# Patient Record
Sex: Male | Born: 1990 | Race: Black or African American | Hispanic: No | Marital: Single | Smoking: Current every day smoker
Health system: Southern US, Community
[De-identification: ages and names within clinical notes are randomized; demographics above are authoritative.]

## PROBLEM LIST (undated history)

## (undated) DIAGNOSIS — A749 Chlamydial infection, unspecified: Secondary | ICD-10-CM

---

## 2016-11-08 ENCOUNTER — Ambulatory Visit (HOSPITAL_COMMUNITY)
Admission: EM | Admit: 2016-11-08 | Discharge: 2016-11-08 | Disposition: A | Discharge status: Home / Self Care | Payer: Self-pay | Attending: Emergency Medicine | Admitting: Emergency Medicine

## 2016-11-08 ENCOUNTER — Encounter (HOSPITAL_COMMUNITY): Payer: Self-pay | Admitting: Emergency Medicine

## 2016-11-08 DIAGNOSIS — Z202 Contact with and (suspected) exposure to infections with a predominantly sexual mode of transmission: Secondary | ICD-10-CM | POA: Insufficient documentation

## 2016-11-08 DIAGNOSIS — F17290 Nicotine dependence, other tobacco product, uncomplicated: Secondary | ICD-10-CM | POA: Insufficient documentation

## 2016-11-08 DIAGNOSIS — Z79899 Other long term (current) drug therapy: Secondary | ICD-10-CM | POA: Insufficient documentation

## 2016-11-08 DIAGNOSIS — Z711 Person with feared health complaint in whom no diagnosis is made: Secondary | ICD-10-CM

## 2016-11-08 MED ORDER — DOXYCYCLINE HYCLATE 100 MG PO CAPS: 100.0000 mg | ORAL_CAPSULE | Freq: Two times a day (BID) | ORAL | 0 refills | Status: DC

## 2016-11-08 MED ORDER — VALACYCLOVIR HCL 1 G PO TABS: ORAL_TABLET | ORAL | 0 refills | Status: DC

## 2016-11-08 NOTE — Discharge Instructions (Signed)
Based on your history and clinical findings there is nothing to strongly suggest a specific type of infection. You are being treated for 2 possible STDs, chlamydia and herpes. A test for both of these have been performed. It is not completely clear at this time that you have these however you are being treated because you are from out of town and will be leaving soon. We will call you for any positive testing and likely can be treated over the telephone for prescriptions if necessary. Read your directions. You will able to follow-up with your primary care physician or health Department for additional STD testing and treatment.

## 2016-11-08 NOTE — ED Notes (Signed)
Clean and dirty urine collected

## 2016-11-08 NOTE — ED Provider Notes (Signed)
CSN: [id number]     Arrival date & time 11/08/16  1826 History   First MD Initiated Contact with Patient 11/08/16 1853     Chief Complaint  Patient presents with  . Exposure to STD   (Consider location/radiation/quality/duration/timing/severity/associated sxs/prior Treatment) 25 year old sexually active male presents to the urgent care with complaints of a tingling feeling to the ventral aspect of the distal shaft of the penis for 2 days. He states there are one or 2 blocks fair. He is concerned about STD. Denies dysuria. He is uncertain as to whether he may have or not have a urethral discharge but later states he did not see anything other than urine.      History reviewed. No pertinent past medical history. History reviewed. No pertinent surgical history. History reviewed. No pertinent family history. Social History  Substance Use Topics  . Smoking status: Current Every Day Smoker    Years: 7.00    Types: Cigars  . Smokeless tobacco: Never Used  . Alcohol use Yes    Review of Systems  Constitutional: Negative.   Gastrointestinal: Negative.   Genitourinary: Positive for genital sores. Negative for dysuria, frequency, penile pain, penile swelling, scrotal swelling, testicular pain and urgency.  Neurological: Negative.     Allergies  Patient has no known allergies.  Home Medications   Prior to Admission medications   Medication Sig Start Date End Date Taking? Authorizing Provider  doxycycline (VIBRAMYCIN) 100 MG capsule Take 1 capsule (100 mg total) by mouth 2 (two) times daily. 11/08/16   Hayden Rasmussenavid Millena Callins, NP  valACYclovir (VALTREX) 1000 MG tablet 1 tab po bid X 7 days 11/08/16   Hayden Rasmussenavid Alister Staver, NP   Meds Ordered and Administered this Visit  Medications - No data to display  BP (!) 176/50 (BP Location: Left Arm)   Pulse 81   Temp 98.6 F (37 C) (Oral)   Resp 14   SpO2 100%  No data found.   Physical Exam  Constitutional: He is oriented to person, place, and time. He  appears well-developed and well-nourished. No distress.  Eyes: EOM are normal.  Neck: Neck supple.  Cardiovascular: Normal rate.   Pulmonary/Chest: Effort normal. No respiratory distress.  Genitourinary:  Genitourinary Comments: Normal male genitalia. Examination to the skin of the distal ventral penile shaft reveals 2 or 3 very small less than 1 mm papules, flesh-colored. No vesicle formation. No erythema. No other lesions seen. No lumps or bumps within the penis. No testicular or scrotal tenderness or abnormalities.  Musculoskeletal: He exhibits no edema.  Neurological: He is alert and oriented to person, place, and time. He exhibits normal muscle tone.  Skin: Skin is warm and dry.  Psychiatric: He has a normal mood and affect.  Nursing note and vitals reviewed.   Urgent Care Course   Clinical Course     Procedures (including critical care time)  Labs Review Labs Reviewed  [facility] CULTURE AND TYPING  URINE CYTOLOGY ANCILLARY ONLY    Imaging Review No results found.   Visual Acuity Review  Right Eye Distance:   Left Eye Distance:   Bilateral Distance:    Right Eye Near:   Left Eye Near:    Bilateral Near:         MDM   1. Concern about STD in male without diagnosis   2. Possible exposure to STD    Based on your history and clinical findings there is nothing to strongly suggest a specific type of infection. You are being treated  for 2 possible STDs, chlamydia and herpes. A test for both of these have been performed. It is not completely clear at this time that you have these however you are being treated because you are from out of town and will be leaving soon. We will call you for any positive testing and likely can be treated over the telephone for prescriptions if necessary. Read your directions. You will able to follow-up with your primary care physician or health Department for additional STD testing and treatment. Meds ordered this encounter  Medications  .  doxycycline (VIBRAMYCIN) 100 MG capsule    Sig: Take 1 capsule (100 mg total) by mouth 2 (two) times daily.    Dispense:  20 capsule    Refill:  0    Order Specific Question:   Supervising Provider    Answer:   Charm RingsHONIG, ERIN J [id number]]  . valACYclovir (VALTREX) 1000 MG tablet    Sig: 1 tab po bid X 7 days    Dispense:  15 tablet    Refill:  0    Order Specific Question:   Supervising Provider    Answer:   Micheline ChapmanHONIG, ERIN J [[id number]]       Hayden Rasmussenavid Kery Haltiwanger, NP 11/08/16 [phone]

## 2016-11-08 NOTE — ED Triage Notes (Signed)
[facility] a [facility] possible exposure to an STD. The patient reported a possible lesion on his penis as well as "tingling."

## 2016-11-09 LAB — URINE CYTOLOGY ANCILLARY ONLY
CHLAMYDIA, DNA PROBE: POSITIVE — AB
NEISSERIA GONORRHEA: NEGATIVE
Trichomonas: NEGATIVE

## 2016-11-10 LAB — HSV CULTURE AND TYPING

## 2018-04-13 ENCOUNTER — Emergency Department (HOSPITAL_COMMUNITY)
Admission: EM | Admit: 2018-04-13 | Discharge: 2018-04-13 | Disposition: A | Discharge status: Home / Self Care | Payer: Self-pay | Attending: Emergency Medicine | Admitting: Emergency Medicine

## 2018-04-13 ENCOUNTER — Other Ambulatory Visit: Payer: Self-pay

## 2018-04-13 ENCOUNTER — Encounter (HOSPITAL_COMMUNITY): Payer: Self-pay

## 2018-04-13 ENCOUNTER — Emergency Department (HOSPITAL_COMMUNITY): Payer: Self-pay

## 2018-04-13 DIAGNOSIS — R079 Chest pain, unspecified: Secondary | ICD-10-CM | POA: Insufficient documentation

## 2018-04-13 DIAGNOSIS — F17290 Nicotine dependence, other tobacco product, uncomplicated: Secondary | ICD-10-CM | POA: Insufficient documentation

## 2018-04-13 HISTORY — DX: Chlamydial infection, unspecified: A74.9

## 2018-04-13 LAB — CBC
HCT: 46.7 % (ref 39.0–52.0)
Hemoglobin: 15.5 g/dL (ref 13.0–17.0)
MCH: 28 pg (ref 26.0–34.0)
MCHC: 33.2 g/dL (ref 30.0–36.0)
MCV: 84.3 fL (ref 78.0–100.0)
PLATELETS: 144 10*3/uL — AB (ref 150–400)
RBC: 5.54 MIL/uL (ref 4.22–5.81)
RDW: 13 % (ref 11.5–15.5)
WBC: 6.2 10*3/uL (ref 4.0–10.5)

## 2018-04-13 LAB — BASIC METABOLIC PANEL
Anion gap: 8 (ref 5–15)
BUN: 15 mg/dL (ref 6–20)
CALCIUM: 9.3 mg/dL (ref 8.9–10.3)
CO2: 26 mmol/L (ref 22–32)
CREATININE: 1.03 mg/dL (ref 0.61–1.24)
Chloride: 106 mmol/L (ref 101–111)
GFR calc Af Amer: >60 mL/min (ref >60)
Glucose, Bld: 76 mg/dL (ref 65–99)
Potassium: 3.8 mmol/L (ref 3.5–5.1)
SODIUM: 140 mmol/L (ref 135–145)

## 2018-04-13 LAB — I-STAT TROPONIN, ED: Troponin i, poc: 0 ng/mL (ref 0.00–0.08)

## 2018-04-13 NOTE — ED Triage Notes (Addendum)
Patient c/o intermittent  left chest pain x 1 week. Patient states it seems to hurt worse after drinking a soda or smoking a black and mild.

## 2018-04-13 NOTE — ED Notes (Signed)
Pt asked if we had a subway. Pt was advised not to have anything to eat or drink until he sees a physician. Pt asked for directions to subway and left for subway.

## 2018-04-13 NOTE — Discharge Instructions (Signed)
It was my pleasure taking care of you today!   You were seen in the Emergency Department today for chest pain.  As we have discussed, today?s blood work and imaging are normal, but you may require further testing.  Please call your primary care physician to schedule a follow up appointment to discuss your ER visit today. If you do not have one, please see the information below or call the clinic listed.   Return to the Emergency Department if you experience any further chest pain/pressure/tightness, difficulty breathing, sudden sweating, or other symptoms that concern you.

## 2018-04-13 NOTE — ED Provider Notes (Signed)
[facility]-EMERGENCY DEPT Provider Note   CSN: [id number] Arrival date & time: 04/13/18  1632     History   Chief Complaint Chief Complaint  Patient presents with  . Chest Pain    HPI Dale Allison is a 27 y.o. male.  The history is provided by the patient and medical records. No language interpreter was used.  Chest Pain   Pertinent negatives include no abdominal pain, no palpitations and no shortness of breath.   Dale Allison is a 27 y.o. male with no pertinent PMH who presents to the Emergency Department complaining of left-sided chest pain which has been occurring daily.  Will last couple hours and then resolved.  Denies alleviating factors.  Pain is worse after drinking carbonated beverages, smoking black and mild or moving his left arm.  No medications taken prior to arrival for symptoms.  Denies history of similar.  No history of hypertension, hyperlipidemia, diabetes or heart disease.  No known family cardiac history.  No recent travel/immobilization/surgeries.  Denies abdominal pain, nausea, vomiting, shortness of breath, back pain, fever, chills, cough.  Past Medical History:  Diagnosis Date  . Chlamydia     There are no active problems to display for this patient.   History reviewed. No pertinent surgical history.      Home Medications    Prior to Admission medications   Not on File    Family History Family History  Problem Relation Age of Onset  . Cancer Mother   . Heart failure Father     Social History Social History   Tobacco Use  . Smoking status: Current Every Day Smoker    Years: 7.00    Types: Cigars  . Smokeless tobacco: Never Used  Substance Use Topics  . Alcohol use: Yes  . Drug use: Yes    Types: Marijuana     Allergies   Patient has no known allergies.   Review of Systems Review of Systems  Respiratory: Negative for shortness of breath.   Cardiovascular: Positive for chest pain. Negative for  palpitations and leg swelling.  Gastrointestinal: Negative for abdominal pain.  All other systems reviewed and are negative.    Physical Exam Updated Vital Signs BP (!) 142/95 (BP Location: Left Arm)   Pulse 72   Temp 98.4 F (36.9 C) (Oral)   Resp 12   Ht  (1.753 m)   Wt 65.8 kg (145 lb)   SpO2 100%   BMI 21.41 kg/m   Physical Exam  Constitutional: He is oriented to person, place, and time. He appears well-developed and well-nourished. No distress.  HENT:  Head: Normocephalic and atraumatic.  Cardiovascular: Normal rate, regular rhythm and normal heart sounds.  No murmur heard. Pulmonary/Chest: Effort normal and breath sounds normal. No respiratory distress. He has no wheezes. He has no rales. He exhibits tenderness.  Abdominal: Soft. He exhibits no distension. There is no tenderness.  Musculoskeletal: He exhibits no edema.  Neurological: He is alert and oriented to person, place, and time.  Skin: Skin is warm and dry.  Nursing note and vitals reviewed.    ED Treatments / Results  Labs (all labs ordered are listed, but only abnormal results are displayed) Labs Reviewed  CBC - Abnormal; Notable for the following components:      Result Value   Platelets 144 (*)    All other components within normal limits  BASIC METABOLIC PANEL  I-STAT TROPONIN, ED    EKG EKG Interpretation  Date/Time:  Friday Apr 13 2018 16:39:31 EDT Ventricular Rate:  63 PR Interval:    QRS Duration: 96 QT Interval:  403 QTC Calculation: 413 R Axis:   -30 Text Interpretation:  Sinus rhythm RSR' in V1 or V2, probably normal variant Left ventricular hypertrophy No old tracing to compare Confirmed by Melene Plan [phone]) on 04/13/2018 7:59:43 PM   Radiology Dg Chest 2 View  Result Date: 04/13/2018 CLINICAL DATA:  Chest pain for 2 weeks. EXAM: CHEST - 2 VIEW COMPARISON:  None. FINDINGS: Lungs are clear. Heart size is normal. No pneumothorax or pleural effusion. No bony abnormality.  IMPRESSION: Normal chest. Electronically Signed   By: Drusilla Kanner M.D.   On: 04/13/2018 17:15    Procedures Procedures (including critical care time)  Medications Ordered in ED Medications - No data to display   Initial Impression / Assessment and Plan / ED Course  I have reviewed the triage vital signs and the nursing notes.  Pertinent labs & imaging results that were available during my care of the patient were reviewed by me and considered in my medical decision making (see chart for details).    Dale Allison is a 27 y.o. male who presents to ED for chest pain x 1 week. On exam, patient with reproducible chest wall tenderness on the left, otherwise normal cardiopulmonary exam.   Labs reviewed and reassuring with negative troponin.  CXR with no acute abnormalities.  EKG non-ischemic.   Heart score low risk at 2. PERC negative  Patient's symptoms unlikely to be of cardiac etiology. Labs and imaging reviewed again prior to discharge. Patient has been advised to return to the ED if development of any exertional chest pain, trouble breathing, new/worsening symptoms or for any additional concerns. Evaluation does not show pathology that would require ongoing emergent intervention or inpatient treatment. Encouraged to follow up with PCP. Referral information included in discharge paperwork. Patient understands return precautions and follow up plan. All questions answered.   Final Clinical Impressions(s) / ED Diagnoses   Final diagnoses:  Chest pain with low risk for cardiac etiology    ED Discharge Orders    None       Alvenia Treese, Chase Picket, PA-C 04/13/18 2010    Melene Plan, DO 04/13/18 2036

## 2018-10-07 ENCOUNTER — Emergency Department (HOSPITAL_COMMUNITY)
Admission: EM | Admit: 2018-10-07 | Discharge: 2018-10-07 | Disposition: A | Discharge status: Home / Self Care | Payer: Self-pay | Attending: Emergency Medicine | Admitting: Emergency Medicine

## 2018-10-07 ENCOUNTER — Encounter (HOSPITAL_COMMUNITY): Payer: Self-pay | Admitting: *Deleted

## 2018-10-07 DIAGNOSIS — F17210 Nicotine dependence, cigarettes, uncomplicated: Secondary | ICD-10-CM | POA: Insufficient documentation

## 2018-10-07 DIAGNOSIS — J069 Acute upper respiratory infection, unspecified: Secondary | ICD-10-CM | POA: Insufficient documentation

## 2018-10-07 MED ORDER — BENZONATATE 200 MG PO CAPS: 200.0000 mg | ORAL_CAPSULE | Freq: Three times a day (TID) | ORAL | 0 refills | Status: AC

## 2018-10-07 NOTE — ED Triage Notes (Signed)
Pt reports back pain, headache, fever x 2 days. Pt tried tylenol but states head still hurts.

## 2018-10-07 NOTE — ED Provider Notes (Signed)
[facility]-EMERGENCY DEPT Provider Note   CSN: [id number] Arrival date & time: 10/07/18  1132     History   Chief Complaint Chief Complaint  Patient presents with  . Headache    HPI Dale Allison is a 27 y.o. male.  27 year old male presents with complaint of productive cough, fever, headache, low back pain.  Patient states that he first noticed symptoms on Friday night, low back ache and feeling hot, symptoms resolved with taking Tylenol.  Patient states that he woke up on Saturday (yesterday) and felt like he could not get warm despite wearing a coat and turning the heat in his home, did not check his temperature but states that his head felt hot, reports nausea, headache and ongoing cough.  Symptoms improved with taking Tylenol again.  Patient states that he is feeling somewhat better today however still feels cold and feels like he is running a fever and came to the emergency room today for a note for work.  Patient denies sick contacts, neck pain or stiffness, body aches, wheezing, shortness of breath, congestion, sore throat, postnasal drip, changes in bowel or bladder habits.  Patient denies history of asthma or chronic lung disease, patient is otherwise healthy, smokes black and mild's, does not vape.  No other complaints or concerns.     Past Medical History:  Diagnosis Date  . Chlamydia     There are no active problems to display for this patient.   History reviewed. No pertinent surgical history.      Home Medications    Prior to Admission medications   Medication Sig Start Date End Date Taking? Authorizing Provider  acetaminophen (TYLENOL) 500 MG tablet Take 1,000 mg by mouth every 6 (six) hours as needed for moderate pain or headache.   Yes [provider]  benzonatate (TESSALON) 200 MG capsule Take 1 capsule (200 mg total) by mouth 3 (three) times daily for 10 days. 10/07/18 10/17/18  Jeannie FendMurphy,  A, PA-C    Family  History Family History  Problem Relation Age of Onset  . Cancer Mother   . Heart failure Father     Social History Social History   Tobacco Use  . Smoking status: Current Every Day Smoker    Years: 7.00    Types: Cigars  . Smokeless tobacco: Never Used  Substance Use Topics  . Alcohol use: Yes  . Drug use: Yes    Types: Marijuana     Allergies   Patient has no known allergies.   Review of Systems Review of Systems  Constitutional: Negative for chills and diaphoresis.  HENT: Negative for congestion, ear pain, postnasal drip, rhinorrhea, sinus pressure, sinus pain, sneezing and sore throat.   Respiratory: Positive for cough. Negative for shortness of breath and wheezing.   Cardiovascular: Negative for chest pain.  Gastrointestinal: Positive for nausea. Negative for abdominal pain, constipation, diarrhea and vomiting.  Genitourinary: Negative for dysuria and frequency.  Musculoskeletal: Positive for back pain.  Skin: Negative for rash and wound.  Allergic/Immunologic: Negative for immunocompromised state.  Neurological: Positive for headaches.  Hematological: Negative for adenopathy.  Psychiatric/Behavioral: Negative for confusion.  All other systems reviewed and are negative.    Physical Exam Updated Vital Signs BP (!) 150/93 (BP Location: Right Arm)   Pulse 65   Temp 99.2 F (37.3 C) (Oral)   Resp 16   Ht 5\' 9"  (1.753 m)   Wt 65.8 kg   SpO2 100%   BMI 21.41 kg/m  Physical Exam  Constitutional: He is oriented to person, place, and time. He appears well-developed and well-nourished. No distress.  HENT:  Head: Normocephalic and atraumatic.  Right Ear: Tympanic membrane, external ear and ear canal normal.  Left Ear: Tympanic membrane, external ear and ear canal normal.  Nose: Nose normal. No mucosal edema.  Mouth/Throat: Oropharynx is clear and moist and mucous membranes are normal. No trismus in the jaw. No oropharyngeal exudate.  Eyes: Conjunctivae are  normal.  Neck: Normal range of motion. Neck supple.  Cardiovascular: Normal rate, regular rhythm, normal heart sounds and intact distal pulses.  No murmur heard. Pulmonary/Chest: Effort normal and breath sounds normal. No respiratory distress.  Musculoskeletal: He exhibits no tenderness.       Cervical back: Normal.       Lumbar back: Normal.  Lymphadenopathy:    He has no cervical adenopathy.  Neurological: He is alert and oriented to person, place, and time. He has normal strength. GCS eye subscore is 4. GCS verbal subscore is 5. GCS motor subscore is 6.  Skin: Skin is warm and dry. He is not diaphoretic.  Psychiatric: He has a normal mood and affect. His behavior is normal.  Nursing note and vitals reviewed.    ED Treatments / Results  Labs (all labs ordered are listed, but only abnormal results are displayed) Labs Reviewed - No data to display  EKG None  Radiology No results found.  Procedures Procedures (including critical care time)  Medications Ordered in ED Medications - No data to display   Initial Impression / Assessment and Plan / ED Course  I have reviewed the triage vital signs and the nursing notes.  Pertinent labs & imaging results that were available during my care of the patient were reviewed by me and considered in my medical decision making (see chart for details).  Clinical Course as of Oct 07 1253  Wynelle Link Oct 07, 2018  [id number] 26 year old well-appearing male with no significant past medical history presents with complaint of productive cough, feeling feverish, backache.  Patient states his symptoms started 2 days ago, managing Tylenol, has not had Tylenol since last night and is afebrile in triage.  On exam lung sounds are clear, neck supple, no evidence of meningitis at this time. Discussed reasons to return to Er, home to rest, push fluids, take Motrin and Tylenol as needed as directed, monitor his temperature.  Given prescription for Tessalon for his cough.   Advised to return to the emergency room for any worsening or concerning symptoms otherwise given referral for PCP follow-up.  Given work note as requested.   [LM]    Clinical Course User Index [LM] Jeannie Fend, PA-C   Final Clinical Impressions(s) / ED Diagnoses   Final diagnoses:  Viral upper respiratory tract infection    ED Discharge Orders         Ordered    benzonatate (TESSALON) 200 MG capsule  3 times daily     10/07/18 1249           Jeannie Fend, PA-C 10/07/18 1254    Lorre Nick, MD 10/08/18 2332

## 2018-10-07 NOTE — Discharge Instructions (Addendum)
Take tessalon as needed as prescribed for cough. Take Motrin and Tylenol as needed as directed. Home to rest, push hydrating fluids such as Gatorade 2. Return to ER for worsening symptoms especially rash, neck pain/stiffness, worsening headache. Monitor your temperature with a thermometer.

## 2018-10-09 ENCOUNTER — Emergency Department (HOSPITAL_COMMUNITY)
Admission: EM | Admit: 2018-10-09 | Discharge: 2018-10-09 | Disposition: A | Discharge status: Home / Self Care | Payer: Self-pay | Attending: Emergency Medicine | Admitting: Emergency Medicine

## 2018-10-09 ENCOUNTER — Encounter (HOSPITAL_COMMUNITY): Payer: Self-pay

## 2018-10-09 ENCOUNTER — Other Ambulatory Visit: Payer: Self-pay

## 2018-10-09 DIAGNOSIS — F17210 Nicotine dependence, cigarettes, uncomplicated: Secondary | ICD-10-CM | POA: Insufficient documentation

## 2018-10-09 DIAGNOSIS — R05 Cough: Secondary | ICD-10-CM | POA: Insufficient documentation

## 2018-10-09 DIAGNOSIS — J111 Influenza due to unidentified influenza virus with other respiratory manifestations: Secondary | ICD-10-CM | POA: Insufficient documentation

## 2018-10-09 DIAGNOSIS — R6889 Other general symptoms and signs: Secondary | ICD-10-CM

## 2018-10-09 NOTE — ED Triage Notes (Signed)
Pt reports continued body aches and chills after being seen for same on Sunday. He states that he feels like he is improving, but his work note only lasted until today and he does not think that he did not feel up to going to work today. No new symptoms. He states that he did not get the prescriptions filled that he was given and has been treating himself with tylenol and ibuprofen instead. A&Ox4.

## 2018-10-09 NOTE — Discharge Instructions (Signed)
Alternate 600 mg of ibuprofen and [phone] mg of Tylenol every 3 hours as needed for pain. Do not exceed 4000 mg of Tylenol daily.  Drink plenty of water and get plenty of rest.  Continue taking the over-the-counter medications you have been taking as prescribed as needed for your symptoms.  Return to the emergency department if any concerning signs or symptoms develop such as neck stiffness, severe headaches, passing out, persistent vomiting, or shortness of breath.

## 2018-10-09 NOTE — ED Provider Notes (Signed)
[facility]-EMERGENCY DEPT Provider Note   CSN: [id number] Arrival date & time: 10/09/18  1657     History   Chief Complaint Chief Complaint  Patient presents with  . Generalized Body Aches    HPI Dale Allison is a 27 y.o. male presents for evaluation of ongoing but improving cough, nasal congestion, fever, headaches, and generalized body aches for 4 days.  He was seen and evaluated in the ER 2 days ago for the symptoms and was found to be stable for discharge home with a prescription for Tessalon which he has not filled.  He reports cough intermittently productive of clear or yellowish sputum and intermittent nasal congestion.  He denies shortness of breath, chest pain, abdominal pain, nausea, or vomiting.  Reports subjective fevers and chills and frontal headache.  Denies neck pain or stiffness.  Headache is not significantly worse than headaches he has had in the past but is more persistent but improves with ibuprofen and Tylenol.  He reports that he has returned because he still feels too unwell to go to work but is encouraged that his symptoms are improving.  He is requesting a work note.  Denies recent travel out of the country or contact with somebody was recently traveled out of the country.  Currently smokes cigarettes and weed daily.  The history is provided by the patient.    Past Medical History:  Diagnosis Date  . Chlamydia     There are no active problems to display for this patient.   History reviewed. No pertinent surgical history.      Home Medications    Prior to Admission medications   Medication Sig Start Date End Date Taking? Authorizing Provider  acetaminophen (TYLENOL) 500 MG tablet Take 1,000 mg by mouth every 6 (six) hours as needed for moderate pain or headache.    [provider]  benzonatate (TESSALON) 200 MG capsule Take 1 capsule (200 mg total) by mouth 3 (three) times daily for 10 days. 10/07/18 10/17/18  Jeannie Fend, PA-C    Family History Family History  Problem Relation Age of Onset  . Cancer Mother   . Heart failure Father     Social History Social History   Tobacco Use  . Smoking status: Current Every Day Smoker    Years: 7.00    Types: Cigars  . Smokeless tobacco: Never Used  Substance Use Topics  . Alcohol use: Yes  . Drug use: Yes    Types: Marijuana     Allergies   Patient has no known allergies.   Review of Systems Review of Systems  Constitutional: Positive for chills and fever.  HENT: Positive for congestion. Negative for sore throat.   Eyes: Negative for visual disturbance.  Respiratory: Positive for cough. Negative for shortness of breath.   Cardiovascular: Negative for chest pain.  Gastrointestinal: Negative for abdominal pain, nausea and vomiting.  Musculoskeletal: Positive for myalgias. Negative for neck pain and neck stiffness.  Neurological: Positive for headaches. Negative for syncope, weakness and light-headedness.     Physical Exam Updated Vital Signs BP (!) 149/92 (BP Location: Right Arm)   Pulse 92   Temp 100.3 F (37.9 C) (Oral)   Resp 16   SpO2 100%   Physical Exam  Constitutional: He appears well-developed and well-nourished. No distress.  Resting comfortably in chair  HENT:  Head: Normocephalic and atraumatic.  Right Ear: Tympanic membrane normal.  Left Ear: Tympanic membrane normal.  Nose: Right sinus exhibits no  maxillary sinus tenderness and no frontal sinus tenderness. Left sinus exhibits no maxillary sinus tenderness and no frontal sinus tenderness.  Mouth/Throat: Uvula is midline. No trismus in the jaw. No posterior oropharyngeal edema or posterior oropharyngeal erythema. Tonsils are 1+ on the right. Tonsils are 1+ on the left. No tonsillar exudate.  Tolerating secretions without difficulty, no sublingual abnormalities.  Mild nasal congestion bilaterally.  Eyes: Conjunctivae are normal. Right eye exhibits no discharge. Left eye  exhibits no discharge.  Neck: Normal range of motion. Neck supple. No JVD present. No tracheal deviation present.  Cardiovascular: Normal rate, regular rhythm and normal heart sounds.  Pulmonary/Chest: Effort normal and breath sounds normal. No stridor. No respiratory distress. He has no wheezes. He has no rales. He exhibits no tenderness.  Abdominal: Soft. Bowel sounds are normal. He exhibits no distension. There is no tenderness.  Musculoskeletal: He exhibits no edema.  Lymphadenopathy:    He has no cervical adenopathy.  Neurological: He is alert.  Skin: Skin is warm and dry. No erythema.  Psychiatric: He has a normal mood and affect. His behavior is normal.  Nursing note and vitals reviewed.    ED Treatments / Results  Labs (all labs ordered are listed, but only abnormal results are displayed) Labs Reviewed - No data to display  EKG None  Radiology No results found.  Procedures Procedures (including critical care time)  Medications Ordered in ED Medications - No data to display   Initial Impression / Assessment and Plan / ED Course  I have reviewed the triage vital signs and the nursing notes.  Pertinent labs & imaging results that were available during my care of the patient were reviewed by me and considered in my medical decision making (see chart for details).     Patient with ongoing URI symptoms.  Low-grade fever, vital signs otherwise stable in the ED.  Nontoxic in appearance.  Tolerating secretions without difficulty.  No meningeal signs or nuchal rigidity to suggest meningitis.  Headache is similar to headaches he has experienced in the past.  Offered chest x-ray to rule out pneumonia but he declines due to cost.  He does have clear breath sounds bilaterally and I have a generally low suspicion of pneumonia.  No evidence of strep pharyngitis, PTA, retropharyngeal abscess, or deep space neck infection.  He is improving symptomatically but does not feel well enough  to go to work yet.  He appears well-hydrated.  Will give work note, encouraged continued over-the-counter medications and fluid hydration.  Discussed strict ED return precautions. Pt verbalized understanding of and agreement with plan and is safe for discharge home at this time.  No complaints prior to discharge.   Final Clinical Impressions(s) / ED Diagnoses   Final diagnoses:  Flu-like symptoms    ED Discharge Orders    None       Bennye AlmFawze, Mariam Helbert A, PA-C 10/09/18 1741    Lorre NickAllen, Anthony, MD 10/13/18 [phone]

## 2020-11-09 ENCOUNTER — Emergency Department (HOSPITAL_BASED_OUTPATIENT_CLINIC_OR_DEPARTMENT_OTHER)
Admission: EM | Admit: 2020-11-09 | Discharge: 2020-11-09 | Disposition: A | Discharge status: Home / Self Care | Payer: Self-pay | Attending: Emergency Medicine | Admitting: Emergency Medicine

## 2020-11-09 ENCOUNTER — Other Ambulatory Visit: Payer: Self-pay

## 2020-11-09 ENCOUNTER — Emergency Department (HOSPITAL_BASED_OUTPATIENT_CLINIC_OR_DEPARTMENT_OTHER): Payer: Self-pay

## 2020-11-09 ENCOUNTER — Encounter (HOSPITAL_BASED_OUTPATIENT_CLINIC_OR_DEPARTMENT_OTHER): Payer: Self-pay

## 2020-11-09 DIAGNOSIS — R079 Chest pain, unspecified: Secondary | ICD-10-CM | POA: Insufficient documentation

## 2020-11-09 DIAGNOSIS — F17290 Nicotine dependence, other tobacco product, uncomplicated: Secondary | ICD-10-CM | POA: Insufficient documentation

## 2020-11-09 LAB — RAPID URINE DRUG SCREEN, HOSP PERFORMED
Amphetamines: NOT DETECTED
Barbiturates: NOT DETECTED
Benzodiazepines: NOT DETECTED
Cocaine: NOT DETECTED
Opiates: NOT DETECTED
Tetrahydrocannabinol: POSITIVE — AB

## 2020-11-09 LAB — TROPONIN I (HIGH SENSITIVITY)
Troponin I (High Sensitivity): 4 ng/L (ref <18)
Troponin I (High Sensitivity): 4 ng/L (ref <18)

## 2020-11-09 LAB — CBC
HCT: 46.9 % (ref 39.0–52.0)
Hemoglobin: 15.5 g/dL (ref 13.0–17.0)
MCH: 28.5 pg (ref 26.0–34.0)
MCHC: 33 g/dL (ref 30.0–36.0)
MCV: 86.4 fL (ref 80.0–100.0)
Platelets: 165 10*3/uL (ref 150–400)
RBC: 5.43 MIL/uL (ref 4.22–5.81)
RDW: 13.7 % (ref 11.5–15.5)
WBC: 5.4 10*3/uL (ref 4.0–10.5)
nRBC: 0 % (ref 0.0–0.2)

## 2020-11-09 LAB — URINALYSIS, ROUTINE W REFLEX MICROSCOPIC
Bilirubin Urine: NEGATIVE
Glucose, UA: NEGATIVE mg/dL
Hgb urine dipstick: NEGATIVE
Ketones, ur: NEGATIVE mg/dL
Leukocytes,Ua: NEGATIVE
Nitrite: NEGATIVE
Protein, ur: NEGATIVE mg/dL
Specific Gravity, Urine: 1.03 (ref 1.005–1.030)
pH: 6 (ref 5.0–8.0)

## 2020-11-09 LAB — BASIC METABOLIC PANEL
Anion gap: 11 (ref 5–15)
BUN: 12 mg/dL (ref 6–20)
CO2: 24 mmol/L (ref 22–32)
Calcium: 8.9 mg/dL (ref 8.9–10.3)
Chloride: 101 mmol/L (ref 98–111)
Creatinine, Ser: 0.97 mg/dL (ref 0.61–1.24)
GFR, Estimated: >60 mL/min (ref >60)
Glucose, Bld: 89 mg/dL (ref 70–99)
Potassium: 4.1 mmol/L (ref 3.5–5.1)
Sodium: 136 mmol/L (ref 135–145)

## 2020-11-09 NOTE — ED Triage Notes (Signed)
Pt reports chest tightness this morning in the center of his chest along with pain in his jaw while working that lasted 4-5 mins. Pt states it comes and goes. Pt was sent from work. Pt reports being diaphoretic but denies n/v or shortness of breath. Pt denies tightness or pain at this time.

## 2020-11-09 NOTE — Discharge Instructions (Addendum)
Please schedule appt with primary doctor. Return for any new or worsening chest pain.

## 2020-11-09 NOTE — ED Provider Notes (Signed)
[facility] EMERGENCY DEPARTMENT Provider Note   CSN: [id number] Arrival date & time: 11/09/20  1013     History Chief Complaint  Patient presents with  . Chest Pain    Dale Allison is a 29 y.o. male.  Presents here with concern for chest pain.  Initial episode of chest pain occurred earlier this morning while he was at work.  States he was not exerting himself at the time.  Episode lasted a few minutes and resolved.  States that he since has had a couple more episodes.  Mostly center of his chest, occasionally radiated to the jaw.  Did not have any associated shortness of breath.  Currently pain free.  States that his father was diagnosed with heart failure but is unsure if he had any coronary artery disease.  Currently smokes.  Denies history of any medical problems.  HPI     Past Medical History:  Diagnosis Date  . Chlamydia     There are no problems to display for this patient.   History reviewed. No pertinent surgical history.     Family History  Problem Relation Age of Onset  . Cancer Mother   . Heart failure Father     Social History   Tobacco Use  . Smoking status: Current Every Day Smoker    Years: 7.00    Types: Cigars  . Smokeless tobacco: Never Used  Vaping Use  . Vaping Use: Never used  Substance Use Topics  . Alcohol use: Yes  . Drug use: Yes    Types: Marijuana    Home Medications Prior to Admission medications   Medication Sig Start Date End Date Taking? Authorizing Provider  acetaminophen (TYLENOL) 500 MG tablet Take 1,000 mg by mouth every 6 (six) hours as needed for moderate pain or headache.    [provider]    Allergies    Patient has no known allergies.  Review of Systems   Review of Systems  Constitutional: Negative for chills and fever.  HENT: Negative for ear pain and sore throat.   Eyes: Negative for pain and visual disturbance.  Respiratory: Positive for chest tightness. Negative for cough and  shortness of breath.   Cardiovascular: Positive for chest pain. Negative for palpitations.  Gastrointestinal: Negative for abdominal pain and vomiting.  Genitourinary: Negative for dysuria and hematuria.  Musculoskeletal: Negative for arthralgias and back pain.  Skin: Negative for color change and rash.  Neurological: Negative for seizures and syncope.  All other systems reviewed and are negative.   Physical Exam Updated Vital Signs BP (!) 145/104   Pulse 64   Temp 98.4 F (36.9 C) (Oral)   Resp 19   Ht 5\' 8"  (1.727 m)   Wt 68 kg   SpO2 100%   BMI 22.81 kg/m   Physical Exam Vitals and nursing note reviewed.  Constitutional:      Appearance: He is well-developed and well-nourished.  HENT:     Head: Normocephalic and atraumatic.  Eyes:     Conjunctiva/sclera: Conjunctivae normal.  Cardiovascular:     Rate and Rhythm: Normal rate and regular rhythm.     Heart sounds: No murmur heard.   Pulmonary:     Effort: Pulmonary effort is normal. No respiratory distress.     Breath sounds: Normal breath sounds.  Abdominal:     Palpations: Abdomen is soft.     Tenderness: There is no abdominal tenderness.  Musculoskeletal:        General: No edema.  Cervical back: Neck supple.     Right lower leg: No edema.     Left lower leg: No edema.  Skin:    General: Skin is warm and dry.  Neurological:     General: No focal deficit present.     Mental Status: He is alert and oriented to person, place, and time.  Psychiatric:        Mood and Affect: Mood and affect normal.     ED Results / Procedures / Treatments   Labs (all labs ordered are listed, but only abnormal results are displayed) Labs Reviewed  RAPID URINE DRUG SCREEN, HOSP PERFORMED - Abnormal; Notable for the following components:      Result Value   Tetrahydrocannabinol POSITIVE (*)    All other components within normal limits  BASIC METABOLIC PANEL  CBC  URINALYSIS, ROUTINE W REFLEX MICROSCOPIC  TROPONIN I  (HIGH SENSITIVITY)  TROPONIN I (HIGH SENSITIVITY)    EKG EKG Interpretation  Date/Time:  Monday November 09 2020 10:13:53 EST Ventricular Rate:  66 PR Interval:  146 QRS Duration: 88 QT Interval:  392 QTC Calculation: 410 R Axis:   79 Text Interpretation: Normal sinus rhythm with sinus arrhythmia Normal ECG Confirmed by Marianna Fuss ([id number]) on 11/09/2020 11:19:03 AM   Radiology DG Chest 2 View  Result Date: 11/09/2020 CLINICAL DATA:  Chest pain EXAM: CHEST - 2 VIEW COMPARISON:  04/13/2018 FINDINGS: The heart size and mediastinal contours are within normal limits. Both lungs are clear. The visualized skeletal structures are unremarkable. IMPRESSION: No active cardiopulmonary disease. Electronically Signed   By: Marlan Palau M.D.   On: 11/09/2020 13:15    Procedures Procedures (including critical care time)  Medications Ordered in ED Medications - No data to display  ED Course  I have reviewed the triage vital signs and the nursing notes.  Pertinent labs & imaging results that were available during my care of the patient were reviewed by me and considered in my medical decision making (see chart for details).    MDM Rules/Calculators/A&P                         29 year old male presenting to ER with concern for chest pain.  On exam patient noted to be well-appearing in no distress.  Vital signs stable.  CXR negative.  EKG without acute ischemic change, troponin x2 within normal limits.  Given atypical description and this work-up, very low suspicion for ACS at this time.  Currently pain-free and well-appearing, believe he is appropriate for outpatient management at this time.  Recommend follow-up with primary doctor.  After the discussed management above, the patient was determined to be safe for discharge.  The patient was in agreement with this plan and all questions regarding their care were answered.  ED return precautions were discussed and the patient will return to  the ED with any significant worsening of condition.    Final Clinical Impression(s) / ED Diagnoses Final diagnoses:  Chest pain, unspecified type    Rx / DC Orders ED Discharge Orders    None       Milagros Loll, MD 11/10/20 [phone]

## 2021-06-20 IMAGING — DX DG CHEST 2V
2 series · 2 of 2 positions shown · non-contrast
Comparison: 04/13/2018

CLINICAL DATA: Chest pain

EXAM:
CHEST - 2 VIEW

[chest pa]
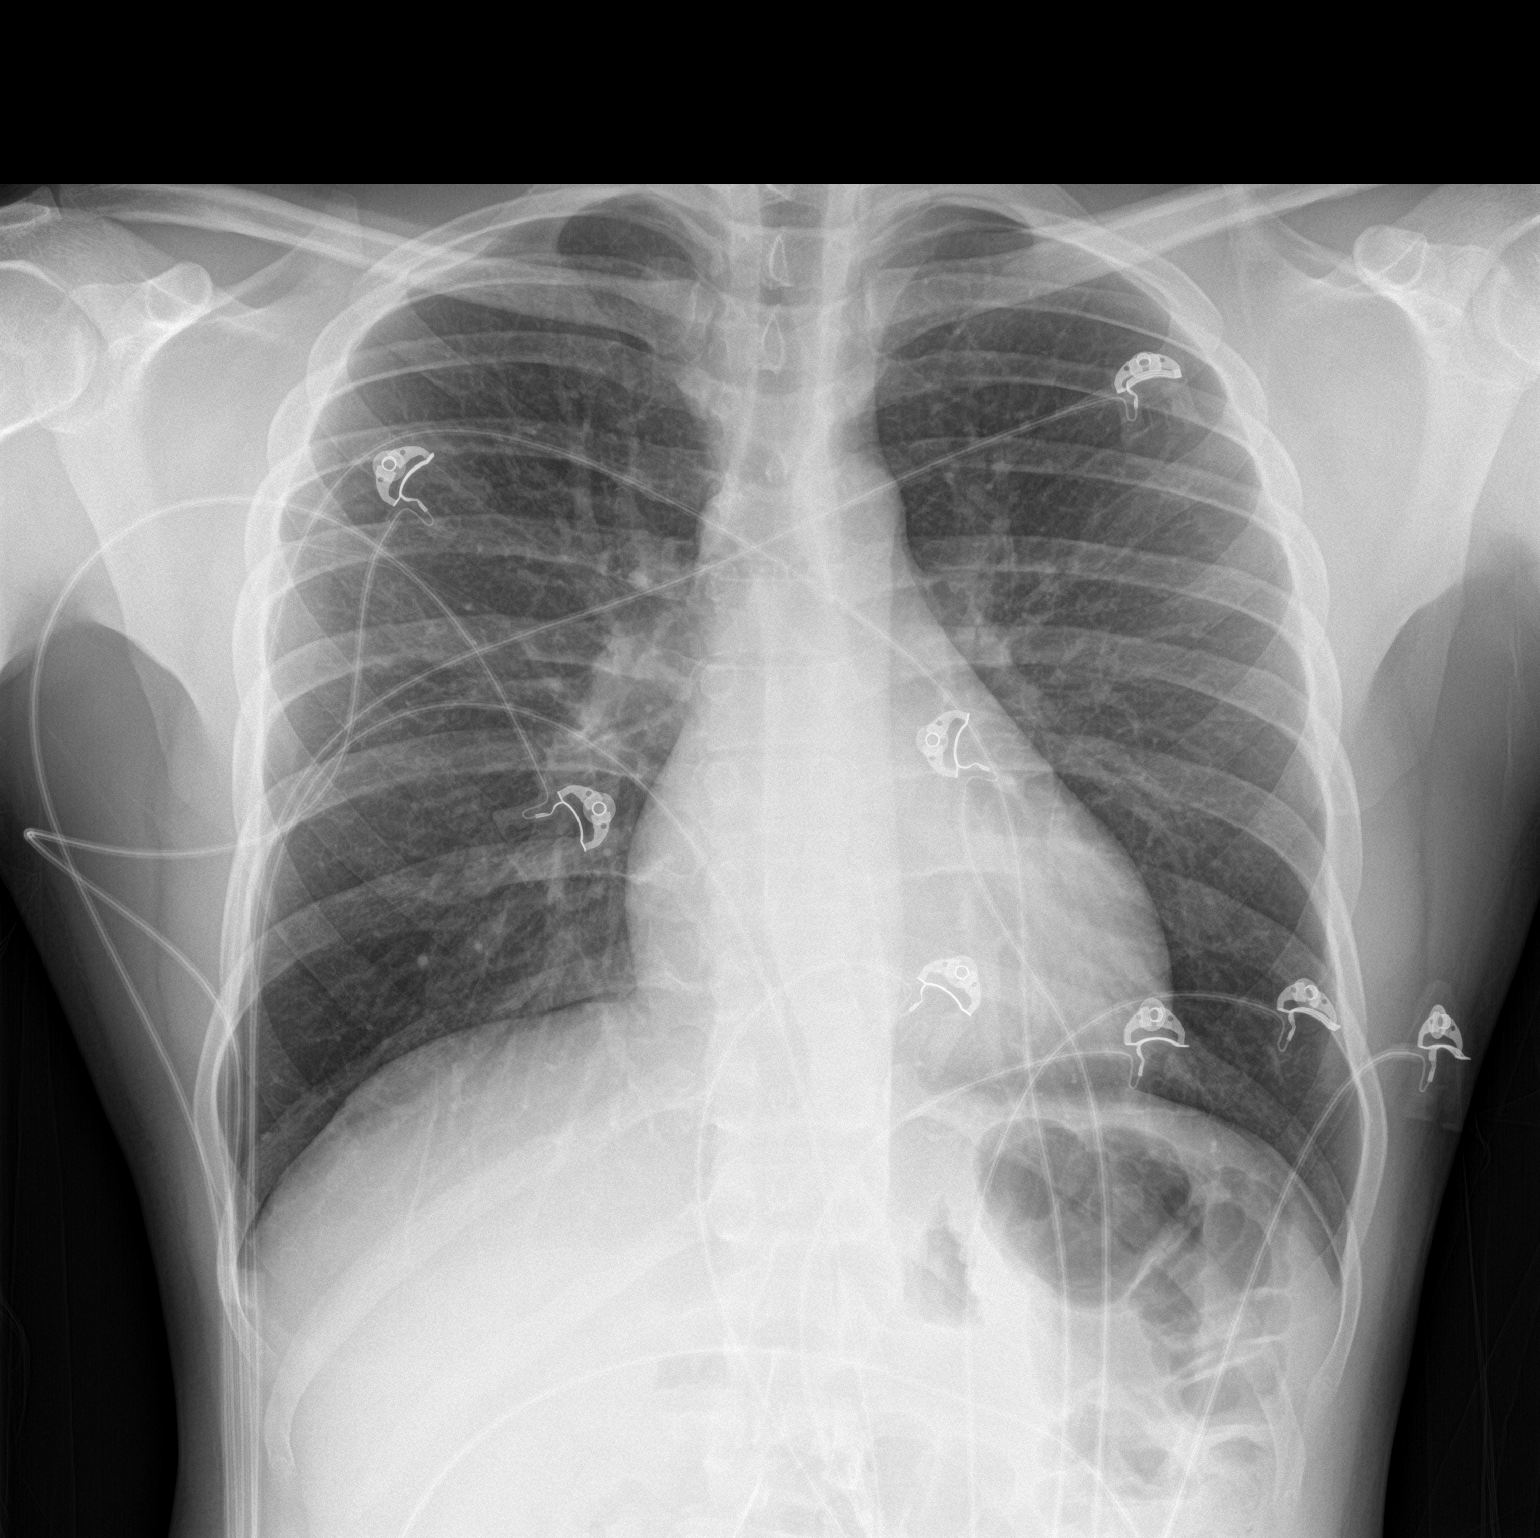

[chest lat]
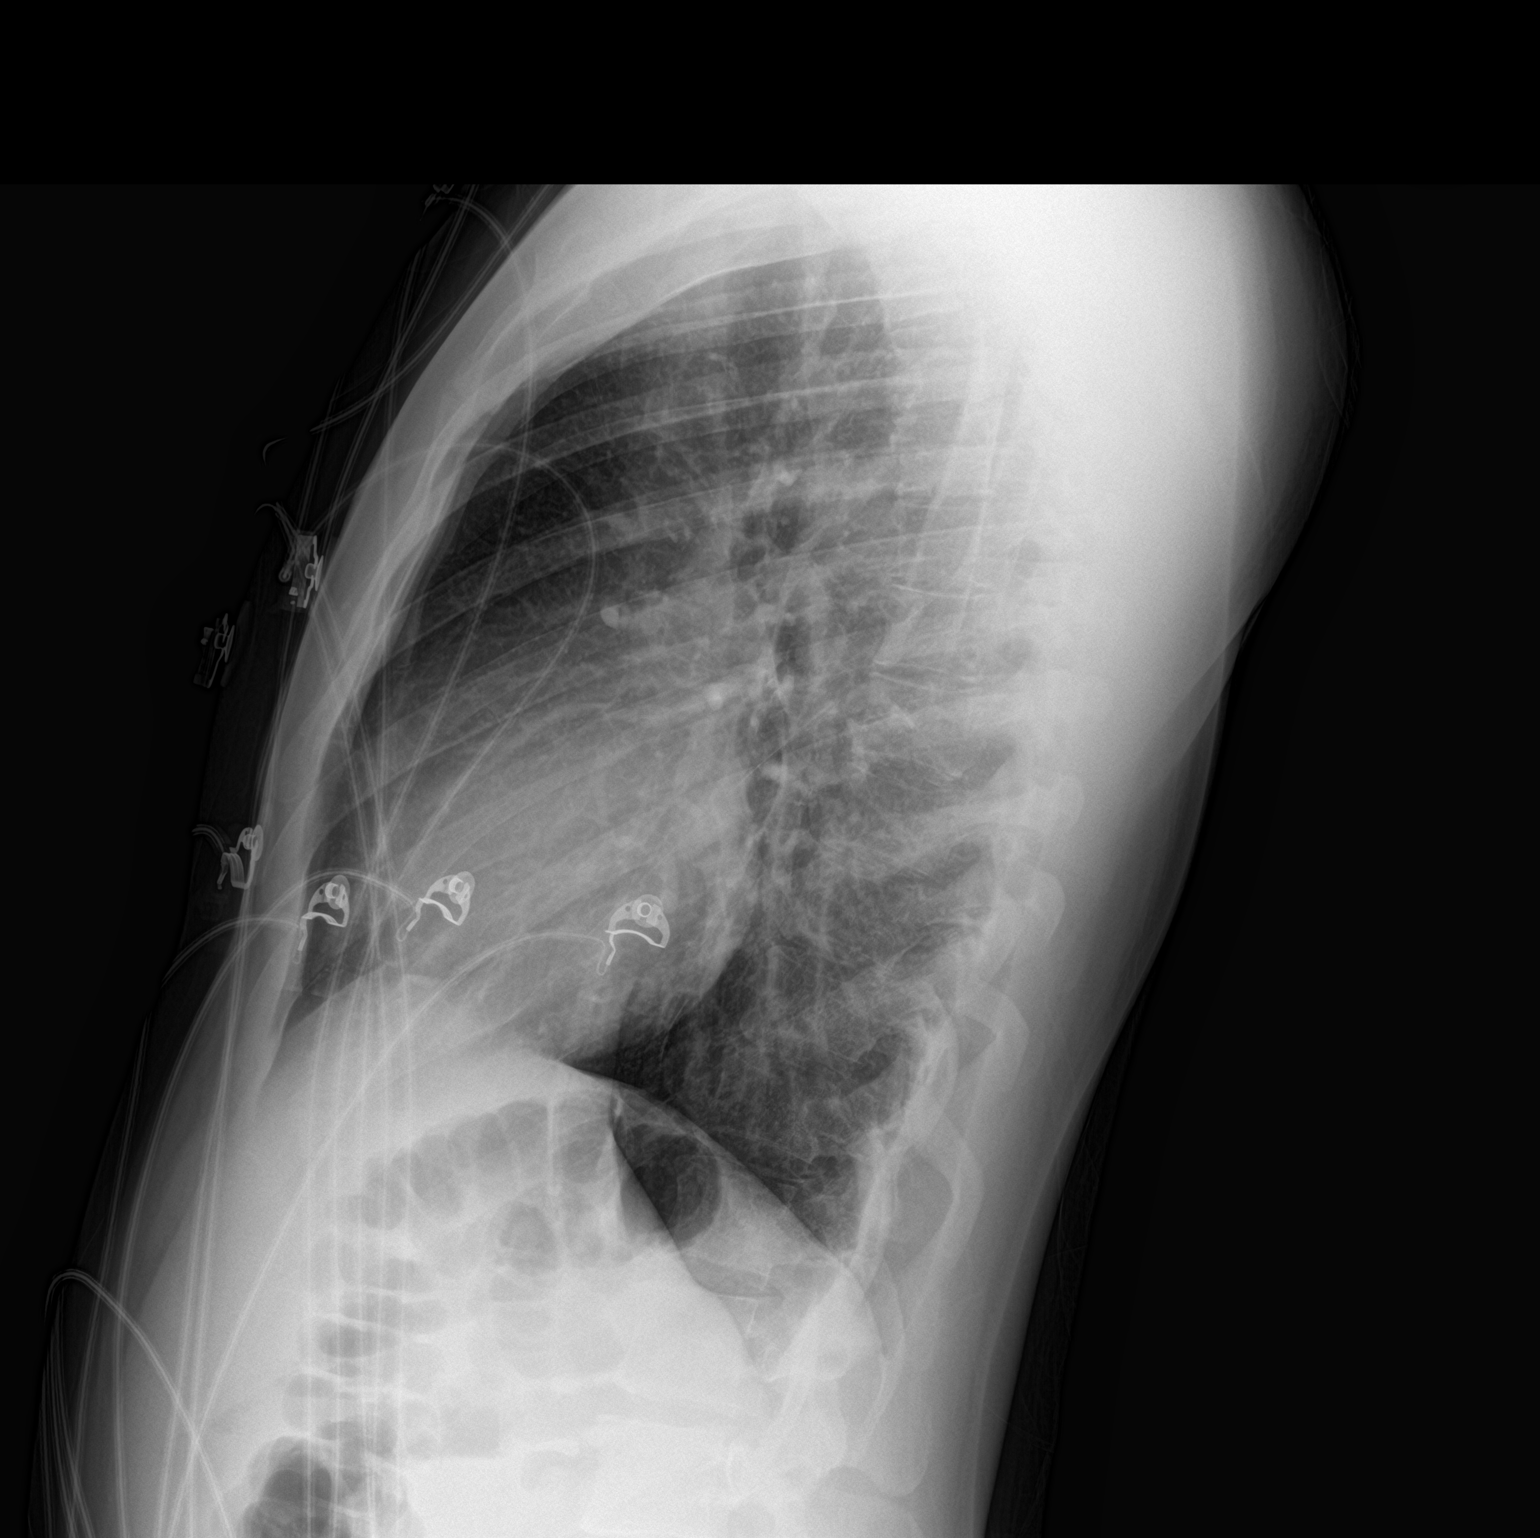

[2 of 2 positions shown; findings below may reference images not displayed]

FINDINGS: The heart size and mediastinal contours are within normal limits.
Both lungs are clear. The visualized skeletal structures are
unremarkable.
IMPRESSION: No active cardiopulmonary disease.
